# Patient Record
Sex: Female | Born: 1965 | Hispanic: No | Marital: Single | Smoking: Never smoker
Health system: Southern US, Community
[De-identification: ages and names within clinical notes are randomized; demographics above are authoritative.]

## PROBLEM LIST (undated history)

## (undated) DIAGNOSIS — T7840XA Allergy, unspecified, initial encounter: Secondary | ICD-10-CM

## (undated) DIAGNOSIS — E079 Disorder of thyroid, unspecified: Secondary | ICD-10-CM

## (undated) HISTORY — DX: Allergy, unspecified, initial encounter: T78.40XA

## (undated) HISTORY — PX: OTHER SURGICAL HISTORY: SHX169

## (undated) HISTORY — DX: Disorder of thyroid, unspecified: E07.9

## (undated) HISTORY — PX: TUBAL LIGATION: SHX77

---

## 2006-03-14 ENCOUNTER — Other Ambulatory Visit: Admission: RE | Admit: 2006-03-14 | Discharge: 2006-03-14 | Payer: Self-pay | Admitting: Gynecology

## 2006-04-08 ENCOUNTER — Encounter (INDEPENDENT_AMBULATORY_CARE_PROVIDER_SITE_OTHER): Payer: Self-pay | Admitting: Specialist

## 2006-04-08 ENCOUNTER — Encounter: Admission: RE | Admit: 2006-04-08 | Discharge: 2006-04-08 | Payer: Self-pay | Admitting: Gynecology

## 2006-07-28 ENCOUNTER — Ambulatory Visit (HOSPITAL_BASED_OUTPATIENT_CLINIC_OR_DEPARTMENT_OTHER): Admission: RE | Admit: 2006-07-28 | Discharge: 2006-07-28 | Payer: Self-pay | Admitting: Obstetrics and Gynecology

## 2007-07-06 ENCOUNTER — Other Ambulatory Visit: Admission: RE | Admit: 2007-07-06 | Discharge: 2007-07-06 | Payer: Self-pay | Admitting: Obstetrics and Gynecology

## 2007-08-08 ENCOUNTER — Encounter: Admission: RE | Admit: 2007-08-08 | Discharge: 2007-08-08 | Payer: Self-pay | Admitting: Obstetrics and Gynecology

## 2008-11-13 ENCOUNTER — Encounter: Admission: RE | Admit: 2008-11-13 | Discharge: 2008-11-13 | Payer: Self-pay | Admitting: Family Medicine

## 2010-02-25 ENCOUNTER — Encounter: Admission: RE | Admit: 2010-02-25 | Discharge: 2010-02-25 | Payer: Self-pay | Admitting: Family Medicine

## 2011-02-05 NOTE — Op Note (Signed)
NAME:  Janice Austin, Janice Austin                  ACCOUNT NO.:  [id number]   MEDICAL RECORD NO.:  [id number]          PATIENT TYPE:  AMB   LOCATION:  [facility]                         FACILITY:  [facility]   PHYSICIAN:  Daniel L. Gottsegen, M.D.DATE OF BIRTH:  1965-11-27   DATE OF PROCEDURE:  07/28/2006  DATE OF DISCHARGE:                                 OPERATIVE REPORT   PREOPERATIVE DIAGNOSIS:  Desire for sterilization.   POSTOPERATIVE DIAGNOSIS:  Desire for sterilization   OPERATION:  Laparoscopic sterilization.   SURGEON:  Dr. Eda Paschal.   ANESTHESIA:  General.   INDICATIONS:  The patient is a 45 year old gravida 2, para 2, AB 0 who  desires permanent sterilization by laparoscopy.  She appreciates that it is  a permanent procedure, but she also understands that there are failures.  She now enters the hospital for the above findings.  At the time of  laparoscopy, ovaries, tubes, and pelvic peritoneum were all free of disease.  The only abnormal finding was at the site of her previous transverse  cesarean section was adherent to the parietal peritoneum by fairly dense  adhesions.   PROCEDURE:  After adequate general endotracheal anesthesia, the patient was  placed in the dorsal lithotomy position.  The patient was prepped and draped  in the usual sterile manner.  Her bladder was emptied with a Robinson  catheter.  A Hulka catheter was inserted into the uterus.  A subumbilical  transverse incision was made.  The abdominal area was held up and using an  OptiVu, direct insertion of the laparoscope was done without difficulty.  A  pneumoperitoneum was created using the operating laparoscope with the  Kleppinger bipolar forceps.  A tubal sterilization was done as follows:  The  right fallopian tube was identified all the way to the fimbriated.  It was  elevated in the mid isthmic portion, coagulated until the tube completely  blanched, and the needle setting went from 5 to 0.  Four centimeters of  consecutive right fallopian tube were handled in such a fashion leaving the  cornual portion unblanched.  The tube was cut, and it separated well.  Attention was next turned to the left tube.  It was identified to the  fimbriated end.  It was elevated in the isthmic portion and coagulated until  the tube blanched and the needle setting once again went from 5 to 0.  Four  centimeters of consecutive left fallopian tube were blanched.  The cornual  area was again left unblanched.  The tube was cut.  At this point, there was  no bleeding noted.  The procedure was terminated.  The trocar was removed.  The pneumoperitoneum was evacuated.  The fascial incision was closed with 0  Vicryl, and the skin incision was closed with 3-0 Monocryl.  Estimated blood  loss for the entire procedure was less than 10 mL with none replaced.  The  patient tolerated the procedure well and left the operating room in  satisfactory condition still draining clear urine from her Foley catheter  which was then removed.     Reuel Boom  L. Eda Paschal, M.D.  Electronically Signed    DLG/MEDQ  D:  07/28/2006  T:  07/28/2006  Job:  [id number]

## 2012-01-28 ENCOUNTER — Ambulatory Visit: Payer: Self-pay | Admitting: Family Medicine

## 2012-01-28 VITALS — BP 119/80 | HR 52 | Temp 98.1°F | Resp 16 | Ht 60.5 in | Wt 201.2 lb

## 2012-01-28 DIAGNOSIS — E039 Hypothyroidism, unspecified: Secondary | ICD-10-CM

## 2012-01-28 LAB — TSH: TSH: 1.008 u[IU]/mL (ref 0.350–4.500)

## 2012-01-28 MED ORDER — LEVOTHYROXINE SODIUM 88 MCG PO TABS: 88.0000 ug | ORAL_TABLET | Freq: Every day | ORAL | Status: DC

## 2012-01-28 NOTE — Progress Notes (Signed)
  [facility]:  Office Visit  Chief Complaint:  Chief Complaint  Patient presents with  . Hypothyroidism    needs a refill on synthroid 88 mcg    HPI: Janice Austin is a 46 y.o. female who complains of:  1. Thyprid medication refills No sxs. Except usual. Weight gain ( no exercise), tiredness ( nursing student and working), dry skin ( takes hot showers).   Past Medical History  Diagnosis Date  . Thyroid disease    History reviewed. No pertinent past surgical history. History   Social History  . Marital Status: Single    Spouse Name: N/A    Number of Children: N/A  . Years of Education: N/A   Social History Main Topics  . Smoking status: Never Smoker   . Smokeless tobacco: None  . Alcohol Use: No  . Drug Use: No  . Sexually Active: None   Other Topics Concern  . None   Social History Narrative  . None   Family History  Problem Relation Age of Onset  . Diabetes Mother   . Hyperlipidemia Mother   . Hyperlipidemia Father    Allergies  Allergen Reactions  . Sulfa Antibiotics Hives   Prior to Admission medications   Medication Sig Start Date End Date Taking? Authorizing Provider  levothyroxine (SYNTHROID, LEVOTHROID) 88 MCG tablet Take 88 mcg by mouth daily.   Yes Historical Provider, MD     ROS: The patient denies fevers, chills, night sweats, unintentional weight loss, chest pain, palpitations, wheezing, dyspnea on exertion, nausea, vomiting, abdominal pain, dysuria, hematuria, melena, numbness, weakness, or tingling.   All other systems have been reviewed and were otherwise negative with the exception of those mentioned in the HPI and as above.    PHYSICAL EXAM: Filed Vitals:   01/28/12 0846  BP: 119/80  Pulse: 52  Temp: 98.1 F (36.7 C)  Resp: 16   Filed Vitals:   01/28/12 0846  Height: 5' 0.5" (1.537 m)  Weight: 201 lb 3.2 oz (91.264 kg)   Body mass index is 38.65 kg/(m^2).  General: Alert, no acute distress HEENT:   Normocephalic, atraumatic, oropharynx patent.  Cardiovascular:  Regular rate and rhythm, no rubs murmurs or gallops.  No Carotid bruits, radial pulse intact. No pedal edema.  Respiratory: Clear to auscultation bilaterally.  No wheezes, rales, or rhonchi.  No cyanosis, no use of accessory musculature GI: No organomegaly, abdomen is soft and non-tender, positive bowel sounds.  No masses. Skin: No rashes. Neurologic: Facial musculature symmetric. Psychiatric: Patient is appropriate throughout our interaction. Lymphatic: No cervical lymphadenopathy. NO thyroidmegaly Musculoskeletal: Gait intact.   LABS: No results found for this or any previous visit.   EKG/XRAY:   Primary read interpreted by Dr. Conley Rolls at [facility]   ASSESSMENT/PLAN: Encounter Diagnosis  Name Primary?  . Hypothyroid Yes    1. Refill Thyroid meds x 1 year, she will return for TSH  q 6 months to 12months due to self pay 2. Check TSH 3. Repeat pulse 67   Nita Whitmire PHUONG, DO 01/28/2012 9:30 AM

## 2012-02-13 ENCOUNTER — Telehealth: Payer: Self-pay | Admitting: Family Medicine

## 2012-02-13 NOTE — Telephone Encounter (Signed)
Lm that TSH was normal.

## 2014-02-13 ENCOUNTER — Ambulatory Visit: Payer: Self-pay

## 2014-02-13 ENCOUNTER — Other Ambulatory Visit: Payer: Self-pay | Admitting: Occupational Medicine

## 2014-02-13 DIAGNOSIS — R7612 Nonspecific reaction to cell mediated immunity measurement of gamma interferon antigen response without active tuberculosis: Secondary | ICD-10-CM

## 2014-03-14 ENCOUNTER — Emergency Department (HOSPITAL_COMMUNITY)
Admission: EM | Admit: 2014-03-14 | Discharge: 2014-03-14 | Disposition: A | Discharge status: Home / Self Care | Payer: 59 | Source: Home / Self Care | Attending: Family Medicine | Admitting: Family Medicine

## 2014-03-14 ENCOUNTER — Encounter (HOSPITAL_COMMUNITY): Payer: Self-pay | Admitting: Emergency Medicine

## 2014-03-14 DIAGNOSIS — E039 Hypothyroidism, unspecified: Secondary | ICD-10-CM

## 2014-03-14 LAB — TSH: TSH: 1.86 u[IU]/mL (ref 0.350–4.500)

## 2014-03-14 MED ORDER — LEVOTHYROXINE SODIUM 88 MCG PO TABS: 88.0000 ug | ORAL_TABLET | Freq: Every day | ORAL | Status: DC

## 2014-03-14 NOTE — Discharge Instructions (Signed)
Continue your efforts at getting established with a new primary care doctor for your ongoing healthcare needs. Please see list below of available primary care doctors in this area.  If the dosage of your medication requires adjustment after your lab work returns we will notify you by phone. Hypothyroidism The thyroid is a large gland located in the lower front of your neck. The thyroid gland helps control metabolism. Metabolism is how your body handles food. It controls metabolism with the hormone thyroxine. When this gland is underactive (hypothyroid), it produces too little hormone.  CAUSES These include:   Absence or destruction of thyroid tissue.  Goiter due to iodine deficiency.  Goiter due to medications.  Congenital defects (since birth).  Problems with the pituitary. This causes a lack of TSH (thyroid stimulating hormone). This hormone tells the thyroid to turn out more hormone. SYMPTOMS  Lethargy (feeling as though you have no energy)  Cold intolerance  Weight gain (in spite of normal food intake)  Dry skin  Coarse hair  Menstrual irregularity (if severe, may lead to infertility)  Slowing of thought processes Cardiac problems are also caused by insufficient amounts of thyroid hormone. Hypothyroidism in the newborn is cretinism, and is an extreme form. It is important that this form be treated adequately and immediately or it will lead rapidly to retarded physical and mental development. DIAGNOSIS  To prove hypothyroidism, your caregiver may do blood tests and ultrasound tests. Sometimes the signs are hidden. It may be necessary for your caregiver to watch this illness with blood tests either before or after diagnosis and treatment. TREATMENT  Low levels of thyroid hormone are increased by using synthetic thyroid hormone. This is a safe, effective treatment. It usually takes about four weeks to gain the full effects of the medication. After you have the full effect of the  medication, it will generally take another four weeks for problems to leave. Your caregiver may start you on low doses. If you have had heart problems the dose may be gradually increased. It is generally not an emergency to get rapidly to normal. HOME CARE INSTRUCTIONS   Take your medications as your caregiver suggests. Let your caregiver know of any medications you are taking or start taking. Your caregiver will help you with dosage schedules.  As your condition improves, your dosage needs may increase. It will be necessary to have continuing blood tests as suggested by your caregiver.  Report all suspected medication side effects to your caregiver. SEEK MEDICAL CARE IF: Seek medical care if you develop:  Sweating.  Tremulousness (tremors).  Anxiety.  Rapid weight loss.  Heat intolerance.  Emotional swings.  Diarrhea.  Weakness. SEEK IMMEDIATE MEDICAL CARE IF:  You develop chest pain, an irregular heart beat (palpitations), or a rapid heart beat. MAKE SURE YOU:   Understand these instructions.  Will watch your condition.  Will get help right away if you are not doing well or get worse. Document Released: 09/06/2005 Document Revised: 11/29/2011 Document Reviewed: 04/26/2008 [facility] Patient Information 2015 [employer] [place]. This information is not intended to replace advice given to you by your health care provider. Make sure you discuss any questions you have with your health care provider.   PRIMARY CARE Merchant navy officerDOCTORS [facility] at [facility] [address]  [place], [place] [phone]  Fax [phone]  Nature conservation officerLeBauer HealthCare at [facility] [address]  [place], [place] [phone]  Fax [phone]  Nature conservation officerLeBauer HealthCare at [place] / [place] [phone]  [place], [place] Ph [phone]  Fax [phone]  [facility] at [facility] [address]   [place], [place] Ph [phone]  Fax [phone]  [facility] [address]  [place], [place] Ph [phone]  Fax [phone]  [facility] at [facility] [address] [place]  [place], [place] [phone]  Fax [phone]   [facility] @ [place] [address] [place] [address] Phone: [phone]   [facility] @ [facility] [address] [place] [address] Phone: [phone]   [facility] @ [place] [address] [place] [address] Phone: [phone]   [facility] @ [place] [address] [place] [address] Phone: [phone]   [facility] @ [place] [address] [place] [address] Phone: [phone] Fax: [phone]   [facility] @ [place] [address] [place] [address] Phone: [phone]

## 2014-03-14 NOTE — ED Notes (Signed)
Pt is asking for  med refill of synthroid.  She is looking for a new PCP

## 2014-03-14 NOTE — ED Provider Notes (Signed)
CSN: [id number]     Arrival date & time 03/14/14  [id number] History   First MD Initiated Contact with Patient 03/14/14 1012     Chief Complaint  Patient presents with  . Medication Refill   HPI Pt denies specific c/o. States she is here to get a refill on her Synthroid. States she has been getting [facility] but states she recently started to [facility] and changed insurance so Dr Hal Hopeichter is no longer in her insurance network.  Past Medical History  Diagnosis Date  . Thyroid disease    Past Surgical History  Procedure Laterality Date  . Cesarian     Family History  Problem Relation Age of Onset  . Diabetes Mother   . Hyperlipidemia Mother   . Hyperlipidemia Father    History  Substance Use Topics  . Smoking status: Never Smoker   . Smokeless tobacco: Not on file  . Alcohol Use: No   OB History   Grav Para Term Preterm Abortions TAB SAB Ect Mult Living                 Review of Systems  All other systems reviewed and are negative.   Allergies  Sulfa antibiotics  Home Medications   Prior to Admission medications   Medication Sig Start Date End Date Taking? Authorizing Provider  levothyroxine (SYNTHROID, LEVOTHROID) 88 MCG tablet Take 1 tablet (88 mcg total) by mouth daily. Patient knows that she needs to get her TSH checked every 6 months-12 months. 01/28/12  Yes Thao P Le, DO  levothyroxine (SYNTHROID) 88 MCG tablet Take 1 tablet (88 mcg total) by mouth daily before breakfast. 03/14/14   Roma KayserKatherine P Randal Goens, NP   BP 135/81  Pulse 48  Temp(Src) 97.9 F (36.6 C) (Oral)  Resp 16  SpO2 100%  LMP 01/05/2012 Physical Exam  Constitutional: She is oriented to person, place, and time. She appears well-developed and well-nourished.  HENT:  Head: Normocephalic and atraumatic.  Eyes: Conjunctivae are normal. Right eye exhibits no discharge. Left eye exhibits no discharge. No scleral icterus.  Neck: Neck supple.  Cardiovascular:  Normal rate.   Pulmonary/Chest: Effort normal.  Neurological: She is alert and oriented to person, place, and time.  Skin: Skin is warm and dry.  Psychiatric: She has a normal mood and affect.    ED Course  Procedures (including critical care time) Labs Review Labs Reviewed  TSH    Imaging Review No results found.   MDM   1. Hypothyroidism, adult    No new symptoms today. PE unremarkable. Will refill Synthroid 88 mcq 1 PO qd. TSH pending. Pt instructed would notify her if dosing needs adjustment. Pt to continue efforts at getting established with a PCP. List provided of providers in this area.     Leanne ChangKatherine P Vedanshi Massaro, NP 03/14/14 1104

## 2014-03-15 NOTE — ED Provider Notes (Signed)
Medical screening examination/treatment/procedure(s) were performed by a resident physician or non-physician practitioner and as the supervising physician I was immediately available for consultation/collaboration.  Evan Corey, MD    Evan S Corey, MD 03/15/14 0735 

## 2014-04-13 ENCOUNTER — Ambulatory Visit (INDEPENDENT_AMBULATORY_CARE_PROVIDER_SITE_OTHER): Payer: 59 | Admitting: Family Medicine

## 2014-04-13 VITALS — BP 104/60 | HR 51 | Temp 98.0°F | Resp 16 | Ht 61.0 in | Wt 170.8 lb

## 2014-04-13 DIAGNOSIS — E039 Hypothyroidism, unspecified: Secondary | ICD-10-CM

## 2014-04-13 MED ORDER — LEVOTHYROXINE SODIUM 88 MCG PO TABS: 88.0000 ug | ORAL_TABLET | Freq: Every day | ORAL | Status: AC

## 2014-04-13 NOTE — Progress Notes (Signed)
Chief Complaint:  Chief Complaint  Patient presents with  . Medication Refill    synthroid    HPI: Janice Austin is a 48 y.o. female who is here for thyrpid medication refills She went to the urgent care last month and they only gave her 1 month, her TSH at that time was normal She takes it in the morning by itself with water, at least 30 min before breakfast  She states that she has been on it since 2005, when she was dx with hypothyroid she was on a diet, eating ony vegetable, told doctor in [place] she was tired and he did a TSH test and low and behold she was thyroid deficient but she thinks it was a very low number. Then she was started on thyroid medicines. She feels like it has not done or change anythign for her. She has no hair or mood issues. She is done with nursing school, she is now at [employer]. She would really like to try to get off of it if she does not need it.She would like to be off of the medicine or leaast do a trial to see if she needs to be on it.   Past Medical History  Diagnosis Date  . Allergy   . Thyroid disease     Hypothryoid   Past Surgical History  Procedure Laterality Date  . Cesarian    . Cesarean section    . Tubal ligation     History   Social History  . Marital Status: Single    Spouse Name: N/A    Number of Children: N/A  . Years of Education: N/A   Social History Main Topics  . Smoking status: Never Smoker   . Smokeless tobacco: None  . Alcohol Use: No  . Drug Use: No  . Sexual Activity: None   Other Topics Concern  . None   Social History Narrative  . None   Family History  Problem Relation Age of Onset  . Hyperlipidemia Mother   . Hypertension Mother   . Hyperlipidemia Father   . Diabetes Father   . Hypertension Father    Allergies  Allergen Reactions  . Sulfa Antibiotics Hives   Prior to Admission medications   Medication Sig Start Date End Date Taking? Authorizing Provider  levothyroxine (SYNTHROID) 88 MCG  tablet Take 1 tablet (88 mcg total) by mouth daily before breakfast. 03/14/14  Yes Roma Kayser Schorr, NP  levothyroxine (SYNTHROID, LEVOTHROID) 88 MCG tablet Take 1 tablet (88 mcg total) by mouth daily. Patient knows that she needs to get her TSH checked every 6 months-12 months. 01/28/12   Thao P Le, DO     ROS: The patient denies fevers, chills, night sweats, unintentional weight loss, chest pain, palpitations, wheezing, dyspnea on exertion, nausea, vomiting, abdominal pain, dysuria, hematuria, melena, numbness, weakness, or tingling.   All other systems have been reviewed and were otherwise negative with the exception of those mentioned in the HPI and as above.    PHYSICAL EXAM: Filed Vitals:   04/13/14 1002  BP: 104/60  Pulse: 51  Temp: 98 F (36.7 C)  Resp: 16   Filed Vitals:   04/13/14 1002  Height: 5\' 1"  (1.549 m)  Weight: 170 lb 12.8 oz (77.474 kg)   Body mass index is 32.29 kg/(m^2).  General: Alert, no acute distress HEENT:  Normocephalic, atraumatic, oropharynx patent. EOMI, PERRLA Cardiovascular:  Regular rate and rhythm, no rubs murmurs or gallops.  No Carotid bruits, radial pulse intact. No pedal edema.  Respiratory: Clear to auscultation bilaterally.  No wheezes, rales, or rhonchi.  No cyanosis, no use of accessory musculature GI: No organomegaly, abdomen is soft and non-tender, positive bowel sounds.  No masses. Skin: No rashes. Neurologic: Facial musculature symmetric. Psychiatric: Patient is appropriate throughout our interaction. Lymphatic: No cervical lymphadenopathy Musculoskeletal: Gait intact.   LABS: Results for orders placed during the hospital encounter of 03/14/14  TSH      Result Value Ref Range   TSH 1.860  0.350 - 4.500 uIU/mL     EKG/XRAY:   Primary [facility]   ASSESSMENT/PLAN: Encounter Diagnosis  Name Primary?  Marland Kitchen. 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  Since her TSH was barely above normal when she started her thyroid medicine, and she is on low dose thyroid medicine We will try a trial of no meds for 2.5 month and she will return to get her TSH free t4 and t3 rechecked at that time.  I am ok with this but she will let me know if  she has any new sxs, if she has any new sxs need to call and  return to taking thyroid medicine. I have given her a printed rx for her thyroid meds F/u prn otherwise in 2 months  Gross sideeffects, risk and benefits, and alternatives of medications d/w patient. Patient is aware that all medications have potential sideeffects and we are unable to predict every sideeffect or drug-drug interaction that may occur.  LE, THAO PHUONG, DO 04/13/2014 10:45 AM

## 2020-07-07 ENCOUNTER — Other Ambulatory Visit: Payer: 59

## 2021-02-19 ENCOUNTER — Other Ambulatory Visit: Payer: Self-pay

## 2021-02-19 ENCOUNTER — Other Ambulatory Visit: Payer: Self-pay | Admitting: Family Medicine

## 2021-02-19 ENCOUNTER — Ambulatory Visit
Admission: RE | Admit: 2021-02-19 | Discharge: 2021-02-19 | Disposition: A | Discharge status: Home / Self Care | Payer: No Typology Code available for payment source | Source: Ambulatory Visit | Attending: Family Medicine | Admitting: Family Medicine

## 2021-02-19 DIAGNOSIS — R059 Cough, unspecified: Secondary | ICD-10-CM

## 2021-12-09 DIAGNOSIS — R6883 Chills (without fever): Secondary | ICD-10-CM | POA: Diagnosis not present

## 2021-12-09 DIAGNOSIS — R059 Cough, unspecified: Secondary | ICD-10-CM | POA: Diagnosis not present

## 2021-12-09 DIAGNOSIS — Z03818 Encounter for observation for suspected exposure to other biological agents ruled out: Secondary | ICD-10-CM | POA: Diagnosis not present

## 2021-12-09 DIAGNOSIS — R52 Pain, unspecified: Secondary | ICD-10-CM | POA: Diagnosis not present

## 2021-12-09 DIAGNOSIS — R0981 Nasal congestion: Secondary | ICD-10-CM | POA: Diagnosis not present

## 2021-12-09 DIAGNOSIS — J029 Acute pharyngitis, unspecified: Secondary | ICD-10-CM | POA: Diagnosis not present

## 2022-10-16 IMAGING — CR DG CHEST 2V
2 series · 2 of 2 positions shown · non-contrast
Comparison: 02/13/2014

CLINICAL DATA: Intermittent cough

EXAM:
CHEST - 2 VIEW

[w chest pa]
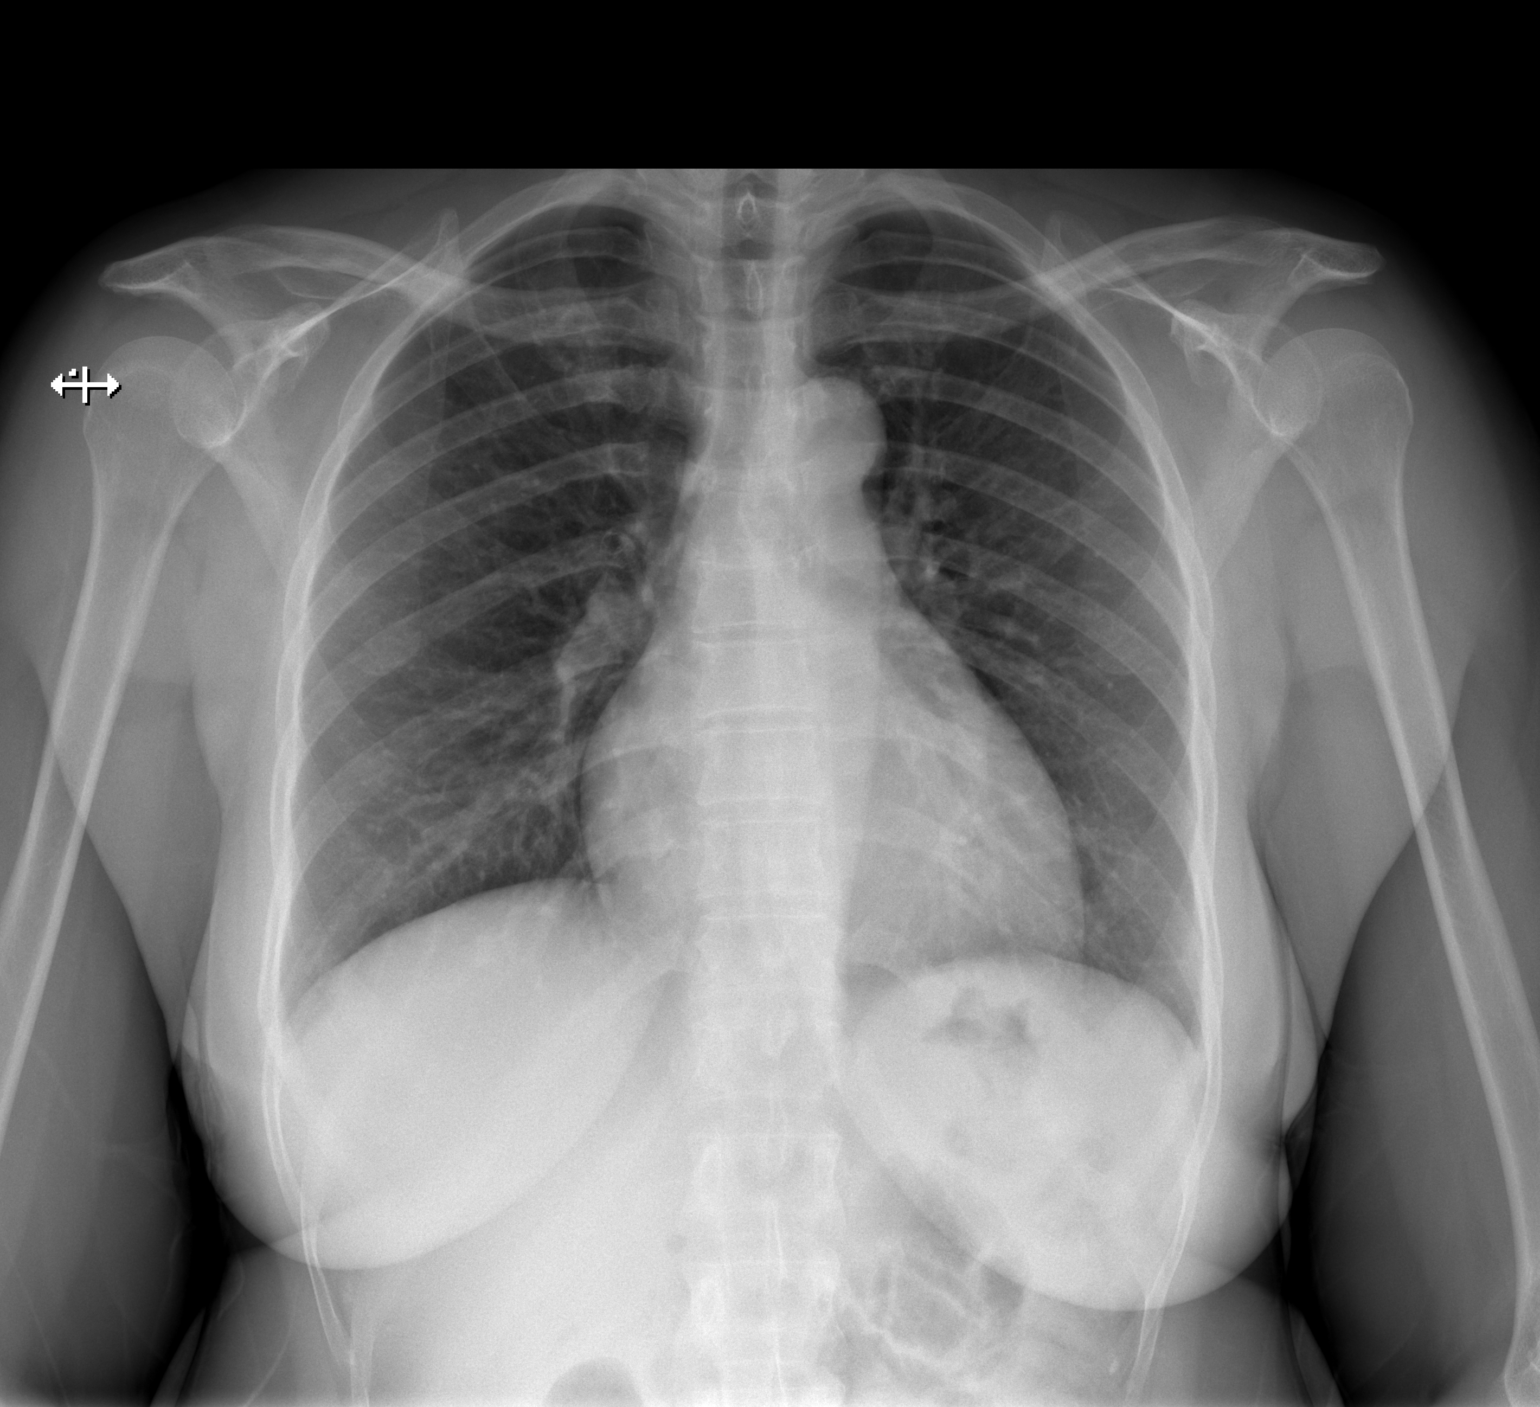

[w chest lat]
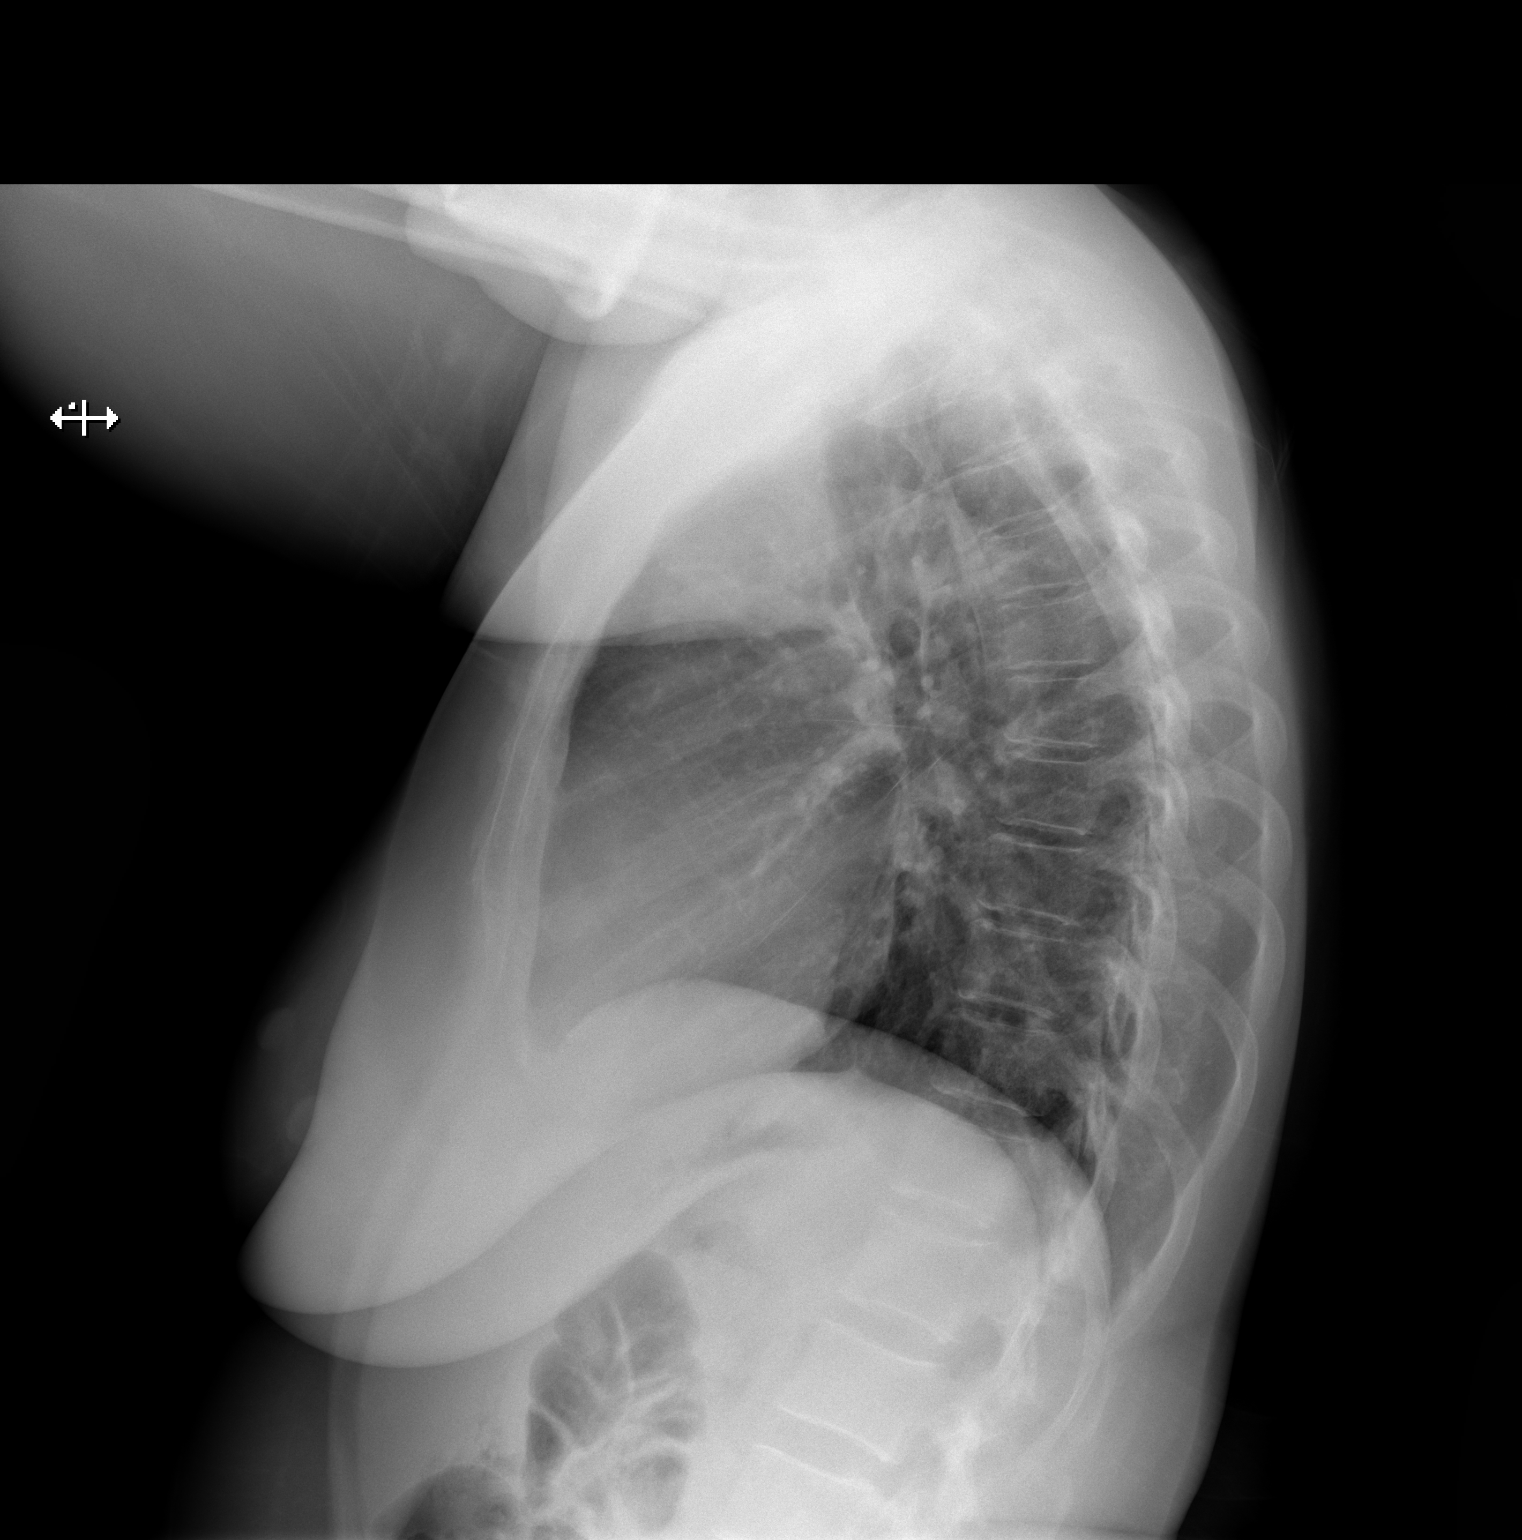

[2 of 2 positions shown; findings below may reference images not displayed]

FINDINGS: The heart size and mediastinal contours are within normal limits.
Both lungs are clear. The visualized skeletal structures are
unremarkable.
IMPRESSION: No active cardiopulmonary disease.
